# Patient Record
Sex: Male | Born: 1995 | Race: Black or African American | Hispanic: No | Marital: Single | Smoking: Current every day smoker
Health system: Southern US, Community
[De-identification: ages and names within clinical notes are randomized; demographics above are authoritative.]

---
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
## 2017-04-30 ENCOUNTER — Encounter (HOSPITAL_BASED_OUTPATIENT_CLINIC_OR_DEPARTMENT_OTHER): Payer: Self-pay

## 2017-04-30 ENCOUNTER — Emergency Department (HOSPITAL_BASED_OUTPATIENT_CLINIC_OR_DEPARTMENT_OTHER)
Admission: EM | Admit: 2017-04-30 | Discharge: 2017-04-30 | Disposition: A | Discharge status: Home / Self Care | Payer: Self-pay | Attending: Emergency Medicine | Admitting: Emergency Medicine

## 2017-04-30 DIAGNOSIS — F17200 Nicotine dependence, unspecified, uncomplicated: Secondary | ICD-10-CM | POA: Insufficient documentation

## 2017-04-30 DIAGNOSIS — R519 Headache, unspecified: Secondary | ICD-10-CM

## 2017-04-30 DIAGNOSIS — R51 Headache: Secondary | ICD-10-CM | POA: Insufficient documentation

## 2017-04-30 MED ORDER — KETOROLAC TROMETHAMINE 30 MG/ML IJ SOLN
15.0000 mg | Freq: Once | INTRAMUSCULAR | Status: AC
  Administered 2017-04-30: 15 mg via INTRAVENOUS
  Filled 2017-04-30: qty 1

## 2017-04-30 MED ORDER — DIPHENHYDRAMINE HCL 50 MG/ML IJ SOLN
12.5000 mg | Freq: Once | INTRAMUSCULAR | Status: AC
  Administered 2017-04-30: 12.5 mg via INTRAVENOUS
  Filled 2017-04-30: qty 1

## 2017-04-30 MED ORDER — SODIUM CHLORIDE 0.9 % IV BOLUS (SEPSIS)
500.0000 mL | Freq: Once | INTRAVENOUS | Status: AC
  Administered 2017-04-30: 500 mL via INTRAVENOUS

## 2017-04-30 MED ORDER — PROCHLORPERAZINE EDISYLATE 5 MG/ML IJ SOLN
5.0000 mg | Freq: Once | INTRAMUSCULAR | Status: AC
  Administered 2017-04-30: 5 mg via INTRAVENOUS
  Filled 2017-04-30: qty 2

## 2017-04-30 NOTE — ED Triage Notes (Signed)
Headaches x2 weeks. Patient reports no relief from taking tylenol and advil. Patient denies pain related to injury.

## 2017-04-30 NOTE — ED Triage Notes (Signed)
Patient states past apartment contained mold. Patient states family member in household diagnosed with meningitis 04/23/17.

## 2017-04-30 NOTE — ED Provider Notes (Signed)
[facility]   CSN: [id number] Arrival date & time: 04/30/17  1230     History   Chief Complaint Chief Complaint  Patient presents with  . Headache    HPI Norman Bell is a 21 y.o. male who presents for evaluation of headache. His headache has been ongoing for 2 weeks. He states that he wakes up with a headache every morning and he takes Tylenol and it goes away however it comes back after the medication wears off. He denies any neurologic complaints or changes in his vision. He states that he gets frequent headaches however they had never lasted this long before. He denies neck stiffness, rash, fevers. He did state that his sister with whom he was living was diagnosed with meningitis about 1 month ago. He is not sure if it is bacterial or viral. He denies photophobia, phonophobia  HPI  No past medical history on file.  There are no active problems to display for this patient.   No past surgical history on file.     Home Medications    Prior to Admission medications   Not on File    Family History History reviewed. No pertinent family history.  Social History Social History  Substance Use Topics  . Smoking status: Current Every Day Smoker    Packs/day: 0.50  . Smokeless tobacco: Never Used  . Alcohol use No     Allergies   Patient has no known allergies.   Review of Systems Review of Systems  Ten systems reviewed and are negative for acute change, except as noted in the HPI.   Physical Exam Updated Vital Signs BP 123/78 (BP Location: Left Arm)   Pulse 77   Temp 98.7 F (37.1 C) (Oral)   Resp 14   Ht  (1.626 m)   Wt 72.6 kg (160 lb)   SpO2 100%   BMI 27.46 kg/m   Physical Exam  Constitutional: He is oriented to person, place, and time. He appears well-developed and well-nourished. No distress.  HENT:  Head: Normocephalic and atraumatic.  Mouth/Throat: Oropharynx is clear and moist.  Eyes: Pupils are equal,  round, and reactive to light. Conjunctivae and EOM are normal. No scleral icterus.  No horizontal, vertical or rotational nystagmus  Neck: Normal range of motion. Neck supple.  Full active and passive ROM without pain No midline or paraspinal tenderness No nuchal rigidity or meningeal signs  Cardiovascular: Normal rate, regular rhythm, normal heart sounds and intact distal pulses.   Pulmonary/Chest: Effort normal and breath sounds normal. No respiratory distress. He has no wheezes. He has no rales.  Abdominal: Soft. Bowel sounds are normal. There is no tenderness. There is no rebound and no guarding.  Musculoskeletal: Normal range of motion. He exhibits no edema.  Lymphadenopathy:    He has no cervical adenopathy.  Neurological: He is alert and oriented to person, place, and time. No cranial nerve deficit. He exhibits normal muscle tone. Coordination normal.  Mental Status:  Alert, oriented, thought content appropriate. Speech fluent without evidence of aphasia. Able to follow 2 step commands without difficulty.  Cranial Nerves:  II:  Peripheral visual fields grossly normal, pupils equal, round, reactive to light III,IV, VI: ptosis not present, extra-ocular motions intact bilaterally  V,VII: smile symmetric, facial light touch sensation equal VIII: hearing grossly normal bilaterally  IX,X: midline uvula rise  XI: bilateral shoulder shrug equal and strong XII: midline tongue extension  Motor:  5/5 in upper and lower extremities  bilaterally including strong and equal grip strength and dorsiflexion/plantar flexion Sensory: Pinprick and light touch normal in all extremities.  Cerebellar: normal finger-to-nose with bilateral upper extremities Gait: normal gait and balance CV: distal pulses palpable throughout   Skin: Skin is warm and dry. No rash noted. He is not diaphoretic.  Psychiatric: He has a normal mood and affect. His behavior is normal. Judgment and thought content normal.  Nursing  note and vitals reviewed.    ED Treatments / Results  Labs (all labs ordered are listed, but only abnormal results are displayed) Labs Reviewed - No data to display  EKG  EKG Interpretation None       Radiology No results found.  Procedures Procedures (including critical care time)  Medications Ordered in ED Medications  sodium chloride 0.9 % bolus 500 mL (500 mLs Intravenous New Bag/Given 04/30/17 1510)  ketorolac (TORADOL) 30 MG/ML injection 15 mg (15 mg Intravenous Given 04/30/17 1509)  prochlorperazine (COMPAZINE) injection 5 mg (5 mg Intravenous Given 04/30/17 1509)  diphenhydrAMINE (BENADRYL) injection 12.5 mg (12.5 mg Intravenous Given 04/30/17 1510)     Initial Impression / Assessment and Plan / ED Course  I have reviewed the triage vital signs and the nursing notes.  Pertinent labs & imaging results that were available during my care of the patient were reviewed by me and considered in my medical decision making (see chart for details).     Pt HA treated and improved while in ED.  Presentation is not  for [facility], ICH, Meningitis, or temporal arteritis. Pt is afebrile with no focal neuro deficits, nuchal rigidity, or change in vision. Pt is to follow up with PCP to discuss prophylactic medication. Pt verbalizes understanding and is agreeable with plan to dc.    Final Clinical Impressions(s) / ED Diagnoses   Final diagnoses:  Bad headache    New Prescriptions New Prescriptions   No medications on file     Arthor Captain, PA-C 04/30/17 1624    Nira Conn, MD 05/03/17 [phone]

## 2017-04-30 NOTE — Discharge Instructions (Signed)

## 2017-04-30 NOTE — ED Notes (Addendum)
Patient reports that he had a "cold" 2 weeks ago. The patient also reports that he does not know if his sister had viral or bacterial Meningitis. Patient denies any Fevers "just feels hot all the time"  - patient states that he wakes up with the Headache and it fluctuates all day. No change in the pain with lights on or off.

## 2017-05-19 ENCOUNTER — Encounter (HOSPITAL_BASED_OUTPATIENT_CLINIC_OR_DEPARTMENT_OTHER): Payer: Self-pay | Admitting: Emergency Medicine

## 2017-05-19 ENCOUNTER — Emergency Department (HOSPITAL_BASED_OUTPATIENT_CLINIC_OR_DEPARTMENT_OTHER)
Admission: EM | Admit: 2017-05-19 | Discharge: 2017-05-19 | Disposition: A | Discharge status: Home / Self Care | Payer: Self-pay | Attending: Physician Assistant | Admitting: Physician Assistant

## 2017-05-19 ENCOUNTER — Emergency Department (HOSPITAL_BASED_OUTPATIENT_CLINIC_OR_DEPARTMENT_OTHER): Payer: Self-pay

## 2017-05-19 DIAGNOSIS — G43009 Migraine without aura, not intractable, without status migrainosus: Secondary | ICD-10-CM | POA: Insufficient documentation

## 2017-05-19 DIAGNOSIS — F17210 Nicotine dependence, cigarettes, uncomplicated: Secondary | ICD-10-CM | POA: Insufficient documentation

## 2017-05-19 MED ORDER — DIPHENHYDRAMINE HCL 50 MG/ML IJ SOLN: 25.0000 mg | Freq: Once | INTRAMUSCULAR | Status: DC

## 2017-05-19 MED ORDER — SODIUM CHLORIDE 0.9 % IV BOLUS (SEPSIS)
1000.0000 mL | Freq: Once | INTRAVENOUS | Status: AC
  Administered 2017-05-19: 1000 mL via INTRAVENOUS

## 2017-05-19 MED ORDER — BUTALBITAL-APAP-CAFFEINE 50-325-40 MG PO TABS
1.0000 | ORAL_TABLET | Freq: Once | ORAL | Status: DC
  Filled 2017-05-19: qty 1

## 2017-05-19 MED ORDER — KETOROLAC TROMETHAMINE 30 MG/ML IJ SOLN
30.0000 mg | Freq: Once | INTRAMUSCULAR | Status: AC
  Administered 2017-05-19: 30 mg via INTRAVENOUS
  Filled 2017-05-19: qty 1

## 2017-05-19 MED ORDER — PROCHLORPERAZINE EDISYLATE 5 MG/ML IJ SOLN: 10.0000 mg | Freq: Once | INTRAMUSCULAR | Status: DC

## 2017-05-19 MED ORDER — ACETAMINOPHEN 325 MG PO TABS
650.0000 mg | ORAL_TABLET | Freq: Once | ORAL | Status: AC
  Administered 2017-05-19: 650 mg via ORAL
  Filled 2017-05-19: qty 2

## 2017-05-19 NOTE — ED Triage Notes (Signed)
Headache x 1 week. Denies N/V, photophobia. Taking excedrin without relief.

## 2017-05-19 NOTE — Discharge Instructions (Signed)
We are happy to give you IV medications for migraine, however you do not have a ride home. Please return if you would like to get these medications we'll be happy to give them to you.  Otherwise please follow-up with the neurologist given the increased in nature of your headaches. You'll need to follow up with her primary care as well.

## 2017-05-19 NOTE — ED Provider Notes (Signed)
[facility]   CSN: [id number] Arrival date & time: 05/19/17  1528     History   Chief Complaint Chief Complaint  Patient presents with  . Headache    HPI Norman Bell is a 21 y.o. male.  HPI   Patient is 21 year old male presenting with daily headache. Patient was seen here 2 weeks ago for headache and treated and felt better.Pt reports today that his sister had been hospitalized one month ago for meningitis.  However, pattient had reported at last vitist that his sister had been hospitalized 1 month ago for meningitis (whgich would now be 8 weeks ago).  It is really unclear when she was actually hospitalized. He is unsure what kind of meningitis she had. He reports no fevers. No systemic symptoms. He just reports daily headache.  History reviewed. No pertinent past medical history.  There are no active problems to display for this patient.   History reviewed. No pertinent surgical history.     Home Medications    Prior to Admission medications   Not on File    Family History No family history on file.  Social History Social History  Substance Use Topics  . Smoking status: Current Every Day Smoker    Packs/day: 0.50  . Smokeless tobacco: Never Used  . Alcohol use No     Allergies   Patient has no known allergies.   Review of Systems Review of Systems  Constitutional: Negative for activity change.  Respiratory: Negative for shortness of breath.   Cardiovascular: Negative for chest pain.  Gastrointestinal: Negative for abdominal pain.  Neurological: Positive for headaches. Negative for dizziness, weakness and light-headedness.  All other systems reviewed and are negative.    Physical Exam Updated Vital Signs BP 129/68 (BP Location: Left Arm)   Pulse 87   Temp 98 F (36.7 C) (Oral)   Resp 18   Ht  (1.626 m)   Wt 68.9 kg (152 lb)   SpO2 98%   BMI 26.09 kg/m   Physical Exam  Constitutional: He is oriented to  person, place, and time. He appears well-nourished.  HENT:  Head: Normocephalic.  Eyes: Conjunctivae are normal.  Cardiovascular: Normal rate.   Pulmonary/Chest: Effort normal.  Neurological: He is oriented to person, place, and time. No cranial nerve deficit. Coordination normal.  Skin: Skin is warm and dry. He is not diaphoretic.  Psychiatric: He has a normal mood and affect. His behavior is normal.     ED Treatments / Results  Labs (all labs ordered are listed, but only abnormal results are displayed) Labs Reviewed - No data to display  EKG  EKG Interpretation None       Radiology No results found.  Procedures Procedures (including critical care time)  Medications Ordered in ED Medications  sodium chloride 0.9 % bolus 1,000 mL (not administered)  prochlorperazine (COMPAZINE) injection 10 mg (not administered)  diphenhydrAMINE (BENADRYL) injection 25 mg (not administered)     Initial Impression / Assessment and Plan / ED Course  I have reviewed the triage vital signs and the nursing notes.  Pertinent labs & imaging results that were available during my care of the patient were reviewed by me and considered in my medical decision making (see chart for details).     Patient is 21 year old male presenting with daily headache. Patient was seen here 2 weeks ago for headache and treated and felt better.Pt reports today that his sister had been hospitalized one month ago for  meningitis.  However, pattient had reported at last vitist that his sister had been hospitalized 1 month ago for meningitis (whgich would now be 8 weeks ago).  It is really unclear when she was actually hospitalized. He is unsure what kind of meningitis she had. He reports no fevers. No systemic symptoms. He just reports daily headache.  5:15 PM Somewhat odd presentation given the patient does not appear particularly uncomfortable.  He does not appear to have light sensitivity/ He has had long time of  headaches, but worse in the last couple months.. He appears comfortable he essentially reports daily headache. We'll get CT head here given the daily headache. Will treat as migraine. Doubt meningitis or encephalitis given no fever and normal neurologic exam. And again will encourage him to follow-up with primary care physician for prophylactic medicine as an outpatient.  Final Clinical Impressions(s) / ED Diagnoses   Final diagnoses:  None    New Prescriptions New Prescriptions   No medications on file     Abelino Derrick, MD 05/19/17 1717

## 2017-05-19 NOTE — ED Notes (Signed)
Patient transported to CT 

## 2017-06-15 ENCOUNTER — Encounter (HOSPITAL_BASED_OUTPATIENT_CLINIC_OR_DEPARTMENT_OTHER): Payer: Self-pay

## 2017-06-15 ENCOUNTER — Emergency Department (HOSPITAL_BASED_OUTPATIENT_CLINIC_OR_DEPARTMENT_OTHER)
Admission: EM | Admit: 2017-06-15 | Discharge: 2017-06-15 | Disposition: A | Discharge status: Home / Self Care | Payer: Self-pay | Attending: Emergency Medicine | Admitting: Emergency Medicine

## 2017-06-15 ENCOUNTER — Encounter: Payer: Self-pay | Admitting: Neurology

## 2017-06-15 DIAGNOSIS — R51 Headache: Secondary | ICD-10-CM | POA: Insufficient documentation

## 2017-06-15 DIAGNOSIS — R519 Headache, unspecified: Secondary | ICD-10-CM

## 2017-06-15 DIAGNOSIS — F17200 Nicotine dependence, unspecified, uncomplicated: Secondary | ICD-10-CM | POA: Insufficient documentation

## 2017-06-15 MED ORDER — PROCHLORPERAZINE EDISYLATE 5 MG/ML IJ SOLN
10.0000 mg | Freq: Once | INTRAMUSCULAR | Status: AC
  Administered 2017-06-15: 10 mg via INTRAMUSCULAR
  Filled 2017-06-15: qty 2

## 2017-06-15 MED ORDER — DIPHENHYDRAMINE HCL 50 MG/ML IJ SOLN
50.0000 mg | Freq: Once | INTRAMUSCULAR | Status: AC
  Administered 2017-06-15: 50 mg via INTRAMUSCULAR
  Filled 2017-06-15: qty 1

## 2017-06-15 NOTE — ED Triage Notes (Signed)
C/o migraine x 2 days-NAD-steady gait

## 2017-06-15 NOTE — Discharge Instructions (Signed)
Follow up with a neurologist.  Return for worsening symptoms.

## 2017-06-15 NOTE — ED Provider Notes (Signed)
[facility] EMERGENCY DEPARTMENT Provider Note   CSN: [id number] Arrival date & time: 06/15/17  1234     History   Chief Complaint Chief Complaint  Patient presents with  . Migraine    HPI Norman Bell is a 21 y.o. male.  21 yo M with a chief complaint of a headache.  Patient has been having headaches chronically this 1 has been going on for the past 3 weeks.  Feels like his prior headaches.  Has never seen a doctor outside of the emergency department for this.  Denies head injury denies morning headaches.  Worse with bright lights.  Denies URI-like symptoms.  Denies cough or congestion.  Denies fevers.   The history is provided by the patient.  Migraine  This is a new problem. The current episode started more than 1 week ago. The problem occurs constantly. The problem has not changed since onset.Associated symptoms include headaches. Pertinent negatives include no chest pain, no abdominal pain and no shortness of breath. Nothing aggravates the symptoms. Nothing relieves the symptoms. He has tried nothing for the symptoms. The treatment provided no relief.    History reviewed. No pertinent past medical history.  There are no active problems to display for this patient.   History reviewed. No pertinent surgical history.     Home Medications    Prior to Admission medications   Not on File    Family History No family history on file.  Social History Social History  Substance Use Topics  . Smoking status: Current Every Day Smoker    Packs/day: 0.50  . Smokeless tobacco: Never Used  . Alcohol use No     Allergies   Patient has no known allergies.   Review of Systems Review of Systems  Constitutional: Negative for chills and fever.  HENT: Negative for congestion and facial swelling.   Eyes: Negative for discharge and visual disturbance.  Respiratory: Negative for shortness of breath.   Cardiovascular: Negative for chest pain and palpitations.    Gastrointestinal: Negative for abdominal pain, diarrhea and vomiting.  Musculoskeletal: Negative for arthralgias and myalgias.  Skin: Negative for color change and rash.  Neurological: Positive for headaches. Negative for tremors and syncope.  Psychiatric/Behavioral: Negative for confusion and dysphoric mood.     Physical Exam Updated Vital Signs BP 131/85 (BP Location: Right Arm)   Pulse 77   Temp 98.3 F (36.8 C) (Oral)   Resp 16   Ht 5\' 4"  (1.626 m)   Wt 70.9 kg (156 lb 4.9 oz)   SpO2 100%   BMI 26.83 kg/m   Physical Exam  Constitutional: He is oriented to person, place, and time. He appears well-developed and well-nourished.  HENT:  Head: Normocephalic and atraumatic.  Eyes: Pupils are equal, round, and reactive to light. EOM are normal.  Neck: Normal range of motion. Neck supple. No JVD present.  Cardiovascular: Normal rate and regular rhythm.  Exam reveals no gallop and no friction rub.   No murmur heard. Pulmonary/Chest: No respiratory distress. He has no wheezes.  Abdominal: He exhibits no distension. There is no rebound and no guarding.  Musculoskeletal: Normal range of motion.  Neurological: He is alert and oriented to person, place, and time. He has normal strength. No cranial nerve deficit or sensory deficit. He displays a negative Romberg sign. Coordination and gait normal. GCS eye subscore is 4. GCS verbal subscore is 5. GCS motor subscore is 6. He displays no Babinski's sign on the right side. He displays  no Babinski's sign on the left side.  Benign neuro exam  Skin: No rash noted. No pallor.  Psychiatric: He has a normal mood and affect. His behavior is normal.  Nursing note and vitals reviewed.    ED Treatments / Results  Labs (all labs ordered are listed, but only abnormal results are displayed) Labs Reviewed - No data to display  EKG  EKG Interpretation None       Radiology No results found.  Procedures Procedures (including critical care  time)  Medications Ordered in ED Medications  prochlorperazine (COMPAZINE) injection 10 mg (10 mg Intramuscular Given 06/15/17 1500)  diphenhydrAMINE (BENADRYL) injection 50 mg (50 mg Intramuscular Given 06/15/17 1501)     Initial Impression / Assessment and Plan / ED Course  I have reviewed the triage vital signs and the nursing notes.  Pertinent labs & imaging results that were available during my care of the patient were reviewed by me and considered in my medical decision making (see chart for details).     21 yo M with a chief complaint of headache.  Feels like his prior.  Never seen a neurologist for this.  Benign neuro exam.  Treat his headache with a headache cocktail.  Reassess.  Feeling better, will d/c home.   3:34 PM:  I have discussed the diagnosis/risks/treatment options with the patient and family and believe the pt to be eligible for discharge home to follow-up with PCP. We also discussed returning to the ED immediately if new or worsening sx occur. We discussed the sx which are most concerning (e.g., sudden worsening pain, fever, inability to tolerate by mouth) that necessitate immediate return. Medications administered to the patient during their visit and any new prescriptions provided to the patient are listed below.  Medications given during this visit Medications  prochlorperazine (COMPAZINE) injection 10 mg (10 mg Intramuscular Given 06/15/17 1500)  diphenhydrAMINE (BENADRYL) injection 50 mg (50 mg Intramuscular Given 06/15/17 1501)     The patient appears reasonably screen and/or stabilized for discharge and I doubt any other medical condition or other [facility] requiring further screening, evaluation, or treatment in the ED at this time prior to discharge.      Final Clinical Impressions(s) / ED Diagnoses   Final diagnoses:  Acute nonintractable headache, unspecified headache type  Headache disorder    New Prescriptions New Prescriptions   No medications  on file     Melene Plan, DO 06/15/17 1534

## 2017-07-18 ENCOUNTER — Other Ambulatory Visit: Payer: Self-pay

## 2017-07-18 ENCOUNTER — Encounter (HOSPITAL_BASED_OUTPATIENT_CLINIC_OR_DEPARTMENT_OTHER): Payer: Self-pay | Admitting: Emergency Medicine

## 2017-07-18 ENCOUNTER — Emergency Department (HOSPITAL_BASED_OUTPATIENT_CLINIC_OR_DEPARTMENT_OTHER)
Admission: EM | Admit: 2017-07-18 | Discharge: 2017-07-18 | Disposition: A | Discharge status: Home / Self Care | Payer: Self-pay | Attending: Emergency Medicine | Admitting: Emergency Medicine

## 2017-07-18 DIAGNOSIS — Z202 Contact with and (suspected) exposure to infections with a predominantly sexual mode of transmission: Secondary | ICD-10-CM | POA: Insufficient documentation

## 2017-07-18 DIAGNOSIS — F17200 Nicotine dependence, unspecified, uncomplicated: Secondary | ICD-10-CM | POA: Insufficient documentation

## 2017-07-18 DIAGNOSIS — R519 Headache, unspecified: Secondary | ICD-10-CM

## 2017-07-18 DIAGNOSIS — R1084 Generalized abdominal pain: Secondary | ICD-10-CM | POA: Insufficient documentation

## 2017-07-18 DIAGNOSIS — R51 Headache: Secondary | ICD-10-CM | POA: Insufficient documentation

## 2017-07-18 LAB — URINALYSIS, ROUTINE W REFLEX MICROSCOPIC
BILIRUBIN URINE: NEGATIVE
GLUCOSE, UA: NEGATIVE mg/dL
Hgb urine dipstick: NEGATIVE
KETONES UR: NEGATIVE mg/dL
Leukocytes, UA: NEGATIVE
Nitrite: NEGATIVE
PH: 6.5 (ref 5.0–8.0)
Protein, ur: NEGATIVE mg/dL
SPECIFIC GRAVITY, URINE: 1.02 (ref 1.005–1.030)

## 2017-07-18 LAB — COMPREHENSIVE METABOLIC PANEL
ALBUMIN: 4 g/dL (ref 3.5–5.0)
ALK PHOS: 59 U/L (ref 38–126)
ALT: 43 U/L (ref 17–63)
ANION GAP: 4 — AB (ref 5–15)
AST: 24 U/L (ref 15–41)
BILIRUBIN TOTAL: 0.5 mg/dL (ref 0.3–1.2)
BUN: 12 mg/dL (ref 6–20)
CO2: 23 mmol/L (ref 22–32)
Calcium: 8.6 mg/dL — ABNORMAL LOW (ref 8.9–10.3)
Chloride: 109 mmol/L (ref 101–111)
Creatinine, Ser: 1.03 mg/dL (ref 0.61–1.24)
GFR calc non Af Amer: >60 mL/min (ref >60)
Glucose, Bld: 99 mg/dL (ref 65–99)
POTASSIUM: 4 mmol/L (ref 3.5–5.1)
Sodium: 136 mmol/L (ref 135–145)
Total Protein: 6.9 g/dL (ref 6.5–8.1)

## 2017-07-18 LAB — CBC WITH DIFFERENTIAL/PLATELET
BASOS PCT: 0 %
Basophils Absolute: 0 10*3/uL (ref 0.0–0.1)
EOS ABS: 0.3 10*3/uL (ref 0.0–0.7)
Eosinophils Relative: 5 %
HEMATOCRIT: 39.7 % (ref 39.0–52.0)
HEMOGLOBIN: 14.1 g/dL (ref 13.0–17.0)
LYMPHS ABS: 2 10*3/uL (ref 0.7–4.0)
Lymphocytes Relative: 36 %
MCH: 32.4 pg (ref 26.0–34.0)
MCHC: 35.5 g/dL (ref 30.0–36.0)
MCV: 91.3 fL (ref 78.0–100.0)
MONO ABS: 0.7 10*3/uL (ref 0.1–1.0)
MONOS PCT: 13 %
NEUTROS PCT: 46 %
Neutro Abs: 2.5 10*3/uL (ref 1.7–7.7)
Platelets: 231 10*3/uL (ref 150–400)
RBC: 4.35 MIL/uL (ref 4.22–5.81)
RDW: 11.7 % (ref 11.5–15.5)
WBC: 5.4 10*3/uL (ref 4.0–10.5)

## 2017-07-18 MED ORDER — LIDOCAINE 5 % EX PTCH
1.0000 | MEDICATED_PATCH | CUTANEOUS | Status: DC
  Filled 2017-07-18: qty 1

## 2017-07-18 MED ORDER — METOCLOPRAMIDE HCL 10 MG PO TABS
10.0000 mg | ORAL_TABLET | Freq: Once | ORAL | Status: AC
  Administered 2017-07-18: 10 mg via ORAL
  Filled 2017-07-18: qty 1

## 2017-07-18 MED ORDER — IBUPROFEN 400 MG PO TABS
600.0000 mg | ORAL_TABLET | Freq: Once | ORAL | Status: AC
  Administered 2017-07-18: 08:00:00 600 mg via ORAL
  Filled 2017-07-18: qty 1

## 2017-07-18 NOTE — Discharge Instructions (Signed)
Continue ibuprofen and tylenol for headache as needed, but avoid taking them more than 5-7 days around the clock because they will cause a rebound headache.  Please follow-up with a primary care provider. They can also help in headache management. You are given neurology info as needed as well.  Your blood work is reassuring. Please return without fail for worsening symptoms, including vomiting, escalating pain, fever, confusion, or any other symptoms concerning to you.

## 2017-07-18 NOTE — ED Provider Notes (Signed)
[facility] EMERGENCY DEPARTMENT Provider Note   CSN: [id number] Arrival date & time: 07/18/17  [id number]     History   Chief Complaint Chief Complaint  Patient presents with  . Exposure to STD  . Generalized Body Aches    HPI Orris Toni ArthursFuller is a 21 y.o. male.  HPI 21 year old male who presents with multiple complaints, generalized headache, intermittent low abdominal pain, and concern for STD exposure.  States for several months now he has had daily generalized headache.  Has been to the ED twice before for evaluation.  States that he continues to take Tylenol and Excedrin daily without improvement in his headaches.  Denies nausea, vomiting, fevers, URI symptoms, vision or speech changes, focal numbness or weakness, or difficulty walking.  States that over the past few weeks he has had some intermittent low abdominal pain, crampy in nature like he is about to have diarrhea but does not.  Denies any associating fever, vomiting or diarrhea, dysuria or urinary frequency, association with food.  Also requests STD testing.  Denies any penile discharge.  Has been sexually active with one partner, and occasionally uses barrier contraception. History reviewed. No pertinent past medical history.  There are no active problems to display for this patient.   History reviewed. No pertinent surgical history.     Home Medications    Prior to Admission medications   Not on File    Family History History reviewed. No pertinent family history.  Social History Social History   Tobacco Use  . Smoking status: Current Every Day Smoker    Packs/day: 0.50  . Smokeless tobacco: Never Used  Substance Use Topics  . Alcohol use: No  . Drug use: Yes    Types: Marijuana     Allergies   Patient has no known allergies.   Review of Systems Review of Systems  Constitutional: Negative for fever.  Respiratory: Negative for shortness of breath.   Cardiovascular: Negative for chest  pain.  Gastrointestinal: Positive for abdominal pain.  Genitourinary: Negative for difficulty urinating and dysuria.  Neurological: Positive for headaches.  All other systems reviewed and are negative.    Physical Exam Updated Vital Signs BP 135/86 (BP Location: Right Arm)   Pulse 70   Temp 97.8 F (36.6 C) (Oral)   Resp 16   Ht 5\' 4"  (1.626 m)   Wt 67.6 kg (149 lb)   SpO2 100%   BMI 25.58 kg/m   Physical Exam Physical Exam  Nursing note and vitals reviewed. Constitutional: Well developed, well nourished, non-toxic, and in no acute distress Head: Normocephalic and atraumatic.  Mouth/Throat: Oropharynx is clear and moist.  Neck: Normal range of motion. Neck supple. No nuchal rigidity Cardiovascular: Normal rate and regular rhythm.   Pulmonary/Chest: Effort normal and breath sounds normal.  Abdominal: Soft. There is no tenderness. There is no rebound and no guarding.  Musculoskeletal: Normal range of motion.  Neurological: Alert, PERRL, no facial droop, fluent speech, moves all extremities symmetrically, full strength bilateral upper and lower extremities, full sensation bilateral upper and lower extremity, non-ataxic gait, no dysmetria, VF in tact Skin: Skin is warm and dry.  Psychiatric: Cooperative   ED Treatments / Results  Labs (all labs ordered are listed, but only abnormal results are displayed) Labs Reviewed  COMPREHENSIVE METABOLIC PANEL - Abnormal; Notable for the following components:      Result Value   Calcium 8.6 (*)    Anion gap 4 (*)    All other components within  normal limits  URINALYSIS, ROUTINE W REFLEX MICROSCOPIC  CBC WITH DIFFERENTIAL/PLATELET  HIV ANTIBODY (ROUTINE TESTING)  RPR  GC/CHLAMYDIA PROBE AMP ([place]) NOT AT [facility]    EKG  EKG Interpretation None       Radiology No results found.  Procedures Procedures (including critical care time)  Medications Ordered in ED Medications  ibuprofen (ADVIL,MOTRIN) tablet 600 mg (600  mg Oral Given 07/18/17 0809)  metoCLOPramide (REGLAN) tablet 10 mg (10 mg Oral Given 07/18/17 0809)     Initial Impression / Assessment and Plan / ED Course  I have reviewed the triage vital signs and the nursing notes.  Pertinent labs & imaging results that were available during my care of the patient were reviewed by me and considered in my medical decision making (see chart for details).     Records reviewed. Seen in Oct twice for generalized headaches. Was referred to neurology which he has not followed with. CT head in Oct reviewed and unremarkable.  He has a normal neurological exam today.  I do not feel that further head imaging is required given normal CT head a few months ago. Patient to continue over-the-counter analgesics as needed.  Referral to neurology and PCP provided for ongoing treatment and workup as needed.  Abdomen is soft and benign, currently nontender.  Blood work reassuring.  No significant STD symptoms, but STD screening tests were obtained.  No empiric treatment provided as he does not have significant symptoms. Presentation not concerning for serious intraabdominal processes currently.  Strict return and follow-up instructions reviewed. He expressed understanding of all discharge instructions and felt comfortable with the plan of care.   Final Clinical Impressions(s) / ED Diagnoses   Final diagnoses:  Generalized headache  Generalized abdominal pain    ED Discharge Orders    None       Lavera GuiseLiu, Tyjah Hai Duo, MD 07/18/17 [phone]

## 2017-07-18 NOTE — ED Triage Notes (Addendum)
Patient reports to ER for STD check.  Reports "flu like symptoms", abdominal pain.  Denies penile discharge, dysuria, hematuria.  States he is unaware if he was exposed to an STD or not.

## 2017-07-19 LAB — RPR: RPR Ser Ql: NONREACTIVE

## 2017-07-19 LAB — HIV ANTIBODY (ROUTINE TESTING W REFLEX): HIV Screen 4th Generation wRfx: NONREACTIVE

## 2017-07-19 LAB — GC/CHLAMYDIA PROBE AMP (~~LOC~~) NOT AT ARMC
Chlamydia: NEGATIVE
Neisseria Gonorrhea: NEGATIVE

## 2017-09-28 ENCOUNTER — Ambulatory Visit: Payer: Self-pay | Admitting: Neurology

## 2019-07-04 IMAGING — CT CT HEAD W/O CM
3 series · 15 of 47 positions shown, 18 images · non-contrast
Comparison: None.

CLINICAL DATA: Headache.

EXAM:
CT HEAD WITHOUT CONTRAST
TECHNIQUE: Contiguous axial images were obtained from the base of the skull
through the vertex without intravenous contrast.

[Series 2: head wo · axial · 0.43mm/px · z∈[+1238,+1368]mm · 9 of 32 slices shown, 12 images]
[im 3/32  brain]
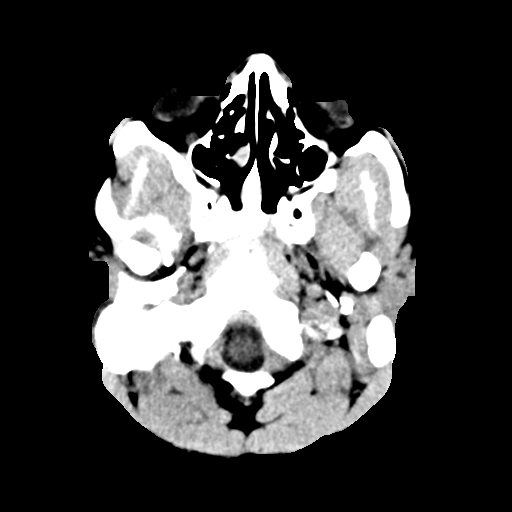
[im 3/32  bone]
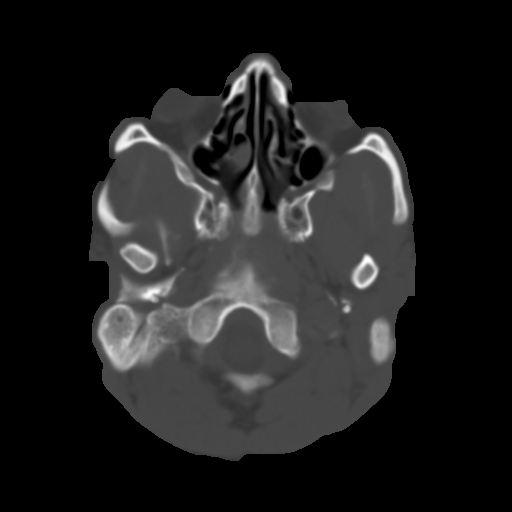
[im 6/32  brain]
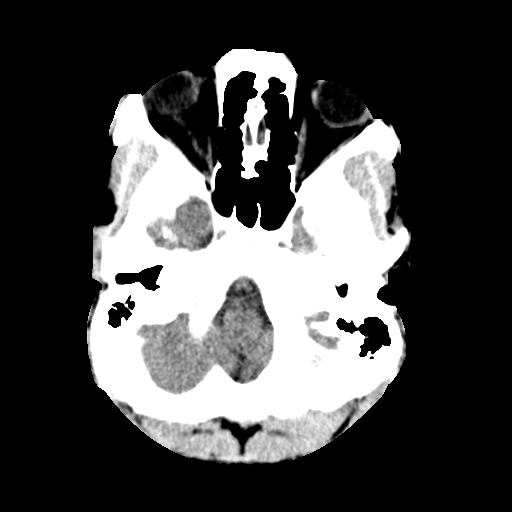
[im 9/32  brain]
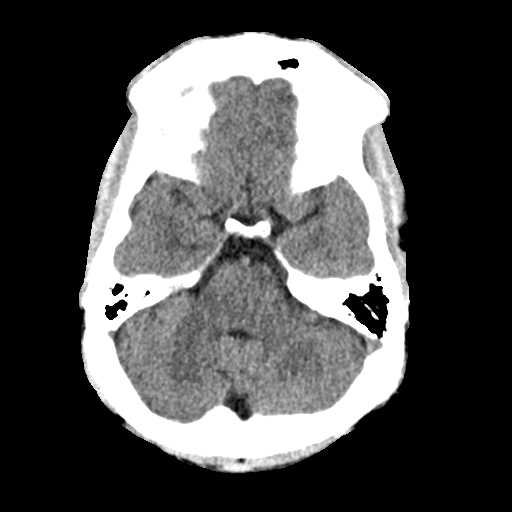
[im 12/32  brain]
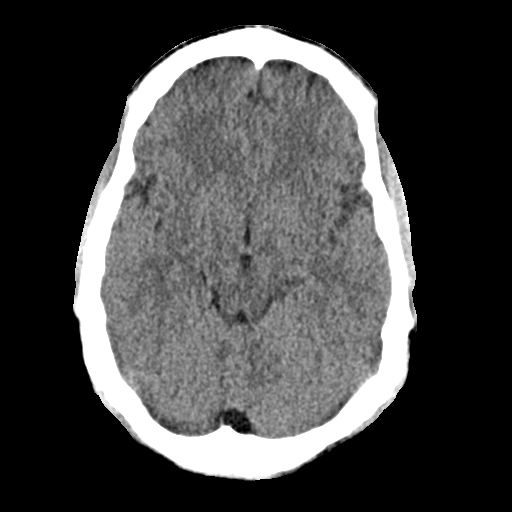
[im 17/32  brain]
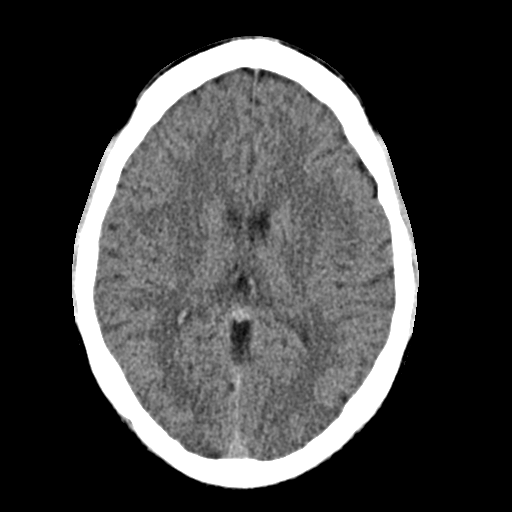
[im 17/32  bone]
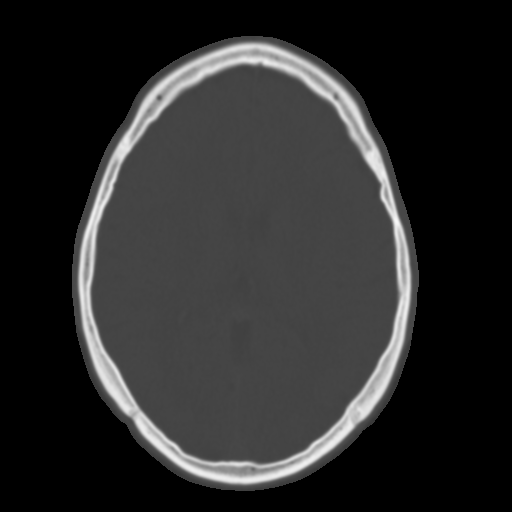
[im 20/32  brain]
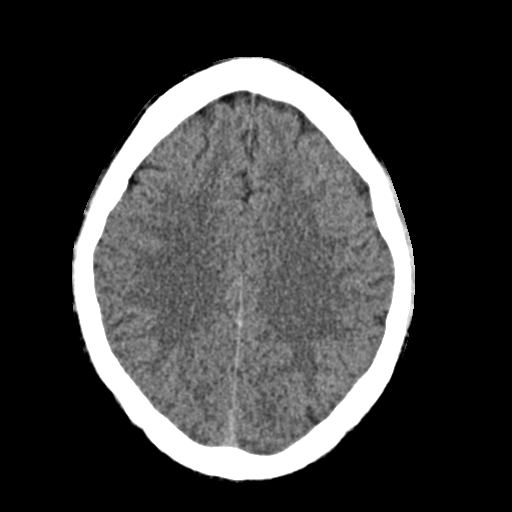
[im 23/32  brain]
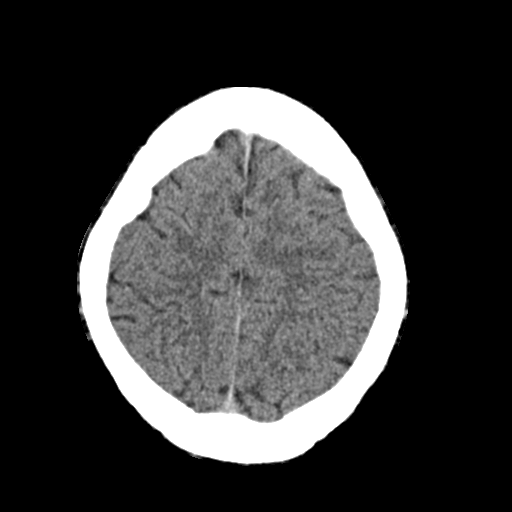
[im 26/32  brain]
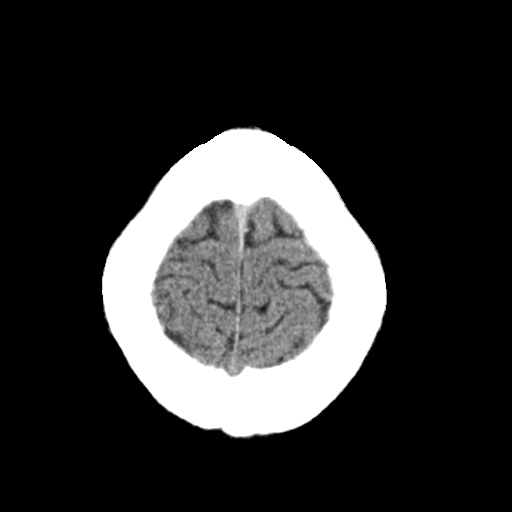
[im 29/32  brain]
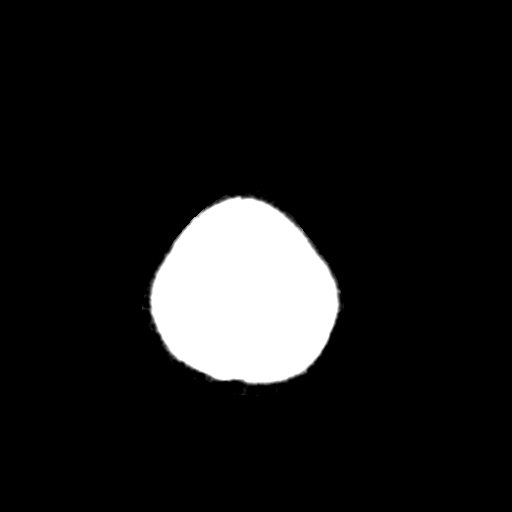
[im 29/32  bone]
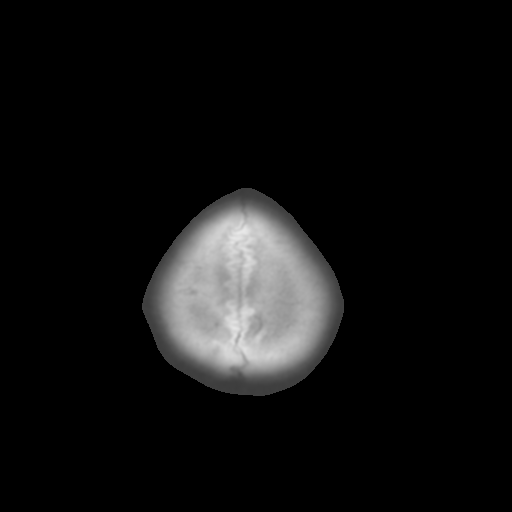

[Series 4: coronal soft · coronal · 0.31mm/px · 3 of 72 slices shown]
[im 24/72  brain]
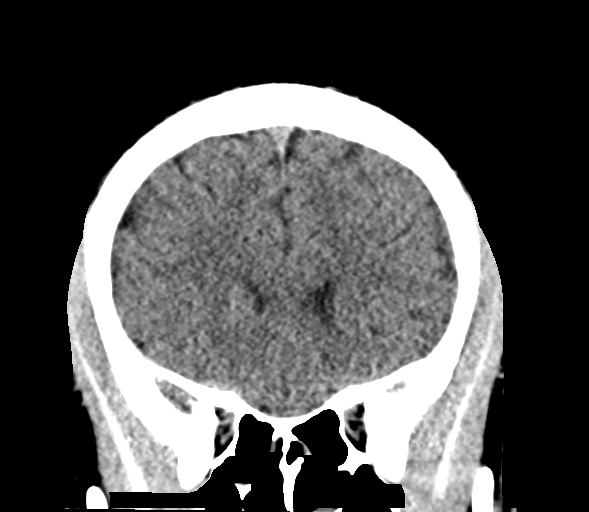
[im 32/72  brain]
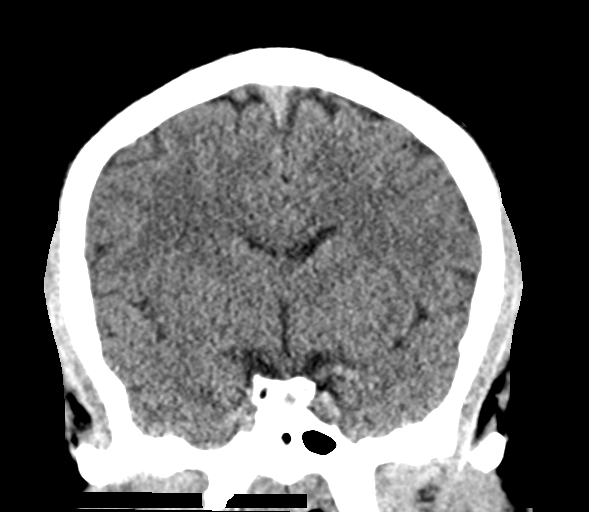
[im 40/72  brain]
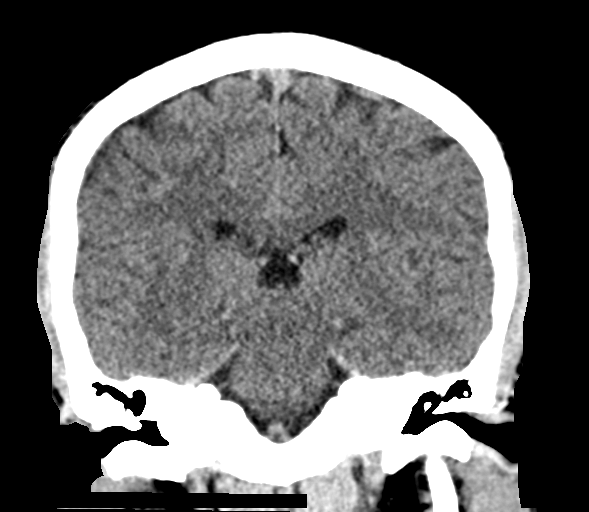

[Series 5: sag soft · sagittal · 0.31mm/px · 3 of 62 slices shown]
[im 21/62  brain]
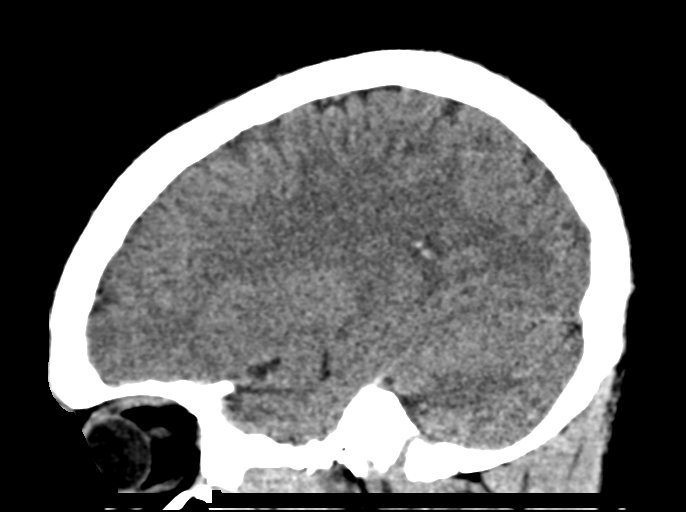
[im 31/62  brain]
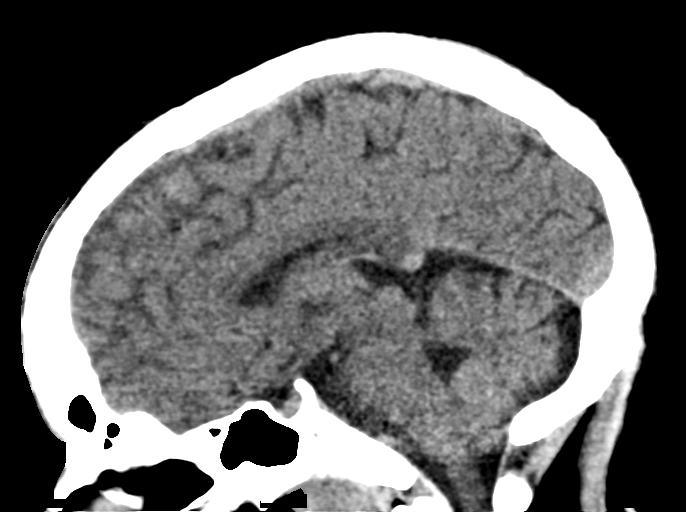
[im 41/62  brain]
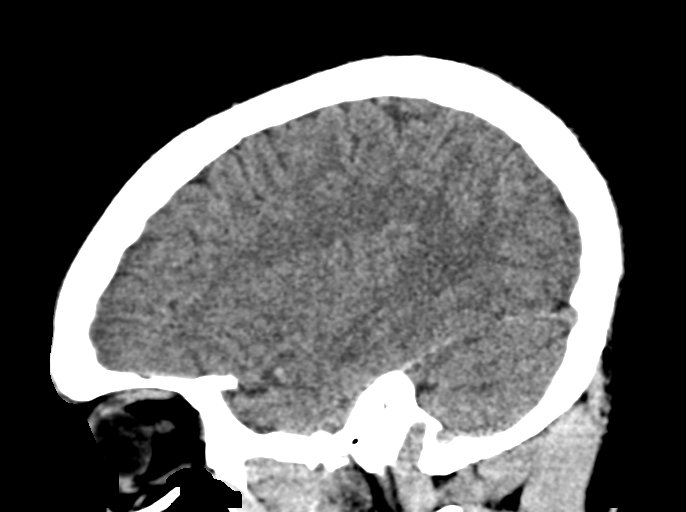

[15 of 47 positions shown; findings below may reference images not displayed]

FINDINGS: Brain: No evidence of acute infarction, hemorrhage, hydrocephalus,
extra-axial collection or mass lesion/mass effect.

Vascular: No hyperdense vessel or unexpected calcification.

Skull: Normal. Negative for fracture or focal lesion.

Sinuses/Orbits: The bilateral paranasal sinuses and mastoid air
cells are clear. The orbits are unremarkable.

Other: None.
IMPRESSION: Normal noncontrast head CT.

## 2024-08-10 ENCOUNTER — Encounter (HOSPITAL_COMMUNITY): Payer: Self-pay

## 2024-08-10 ENCOUNTER — Ambulatory Visit (HOSPITAL_COMMUNITY)
Admission: EM | Admit: 2024-08-10 | Discharge: 2024-08-10 | Disposition: A | Discharge status: Home / Self Care | Attending: Physician Assistant | Admitting: Physician Assistant

## 2024-08-10 DIAGNOSIS — J101 Influenza due to other identified influenza virus with other respiratory manifestations: Secondary | ICD-10-CM

## 2024-08-10 DIAGNOSIS — J4521 Mild intermittent asthma with (acute) exacerbation: Secondary | ICD-10-CM

## 2024-08-10 DIAGNOSIS — R051 Acute cough: Secondary | ICD-10-CM | POA: Diagnosis not present

## 2024-08-10 LAB — POC COVID19/FLU A&B COMBO
Covid Antigen, POC: NEGATIVE
Influenza A Antigen, POC: NEGATIVE
Influenza B Antigen, POC: POSITIVE — AB

## 2024-08-10 MED ORDER — METHYLPREDNISOLONE SODIUM SUCC 125 MG IJ SOLR
INTRAMUSCULAR | Status: AC
  Filled 2024-08-10: qty 2

## 2024-08-10 MED ORDER — METHYLPREDNISOLONE SODIUM SUCC 125 MG IJ SOLR
60.0000 mg | Freq: Once | INTRAMUSCULAR | Status: AC
  Administered 2024-08-10: 60 mg via INTRAMUSCULAR

## 2024-08-10 MED ORDER — OSELTAMIVIR PHOSPHATE 75 MG PO CAPS: 75.0000 mg | ORAL_CAPSULE | Freq: Two times a day (BID) | ORAL | 0 refills | Status: AC

## 2024-08-10 MED ORDER — PREDNISONE 20 MG PO TABS: 40.0000 mg | ORAL_TABLET | Freq: Every day | ORAL | 0 refills | Status: AC

## 2024-08-10 MED ORDER — PROMETHAZINE-DM 6.25-15 MG/5ML PO SYRP: 5.0000 mL | ORAL_SOLUTION | Freq: Two times a day (BID) | ORAL | 0 refills | Status: AC | PRN

## 2024-08-10 MED ORDER — IPRATROPIUM-ALBUTEROL 0.5-2.5 (3) MG/3ML IN SOLN
RESPIRATORY_TRACT | Status: AC
  Filled 2024-08-10: qty 3

## 2024-08-10 MED ORDER — IPRATROPIUM-ALBUTEROL 0.5-2.5 (3) MG/3ML IN SOLN
3.0000 mL | Freq: Once | RESPIRATORY_TRACT | Status: AC
  Administered 2024-08-10: 3 mL via RESPIRATORY_TRACT

## 2024-08-10 MED ORDER — ALBUTEROL SULFATE HFA 108 (90 BASE) MCG/ACT IN AERS: 1.0000 | INHALATION_SPRAY | Freq: Four times a day (QID) | RESPIRATORY_TRACT | 0 refills | Status: AC | PRN

## 2024-08-10 NOTE — Discharge Instructions (Signed)
 You tested positive for influenza B.  Start Tamiflu  twice daily for 5 days.  I am glad you are feeling better after the medication.  Use albuterol  every 4-6 hours as needed for shortness of breath and coughing fits.  Start prednisone  tomorrow (08/11/2024).  Do not take NSAIDs with this medication including aspirin, ibuprofen /Advil , naproxen/Aleve.  Use Promethazine  DM for cough.  This will make you sleepy so do not drive or drink alcohol while taking it.  You can use Mucinex, Flonase, nasal saline/sinus rinses, humidifier in your room to help manage the symptoms.  If you are not feeling better within 3 to 5 days please return for reevaluation.  If anything worsens and you have high fever, worsening cough, shortness of breath,, chest pain weakness you need to be seen immediately.

## 2024-08-10 NOTE — ED Provider Notes (Signed)
 " [facility]    CSN: [id number] Arrival date & time: 08/10/24  0841      History   Chief Complaint Chief Complaint  Patient presents with   Shortness of Breath   Cough    HPI Norman Bell is a 28 y.o. male.   Patient presents today with a 2-day history of URI symptoms.  He reports subjective fever, shortness of breath, cough, body aches, fatigue.  He did have an episode of nausea/vomiting when his symptoms began but thought that this was related to the medication he had just taken and he has not had any recurrent GI symptoms.  He has been taking DayQuil and NyQuil without improvement of symptoms.  His last dose was this morning about 2-1/2 hours ago.  He reports that his niece has been sick with similar symptoms.  He does report a history of asthma when he was younger but has not needed an albuterol  inhaler in adulthood.  He does smoke but denies history of COPD.  Denies any recent antibiotics or steroids.  He has not had influenza or COVID-19 vaccines.  He had COVID several years ago but not within the past 90 days.    History reviewed. No pertinent past medical history.  There are no active problems to display for this patient.   History reviewed. No pertinent surgical history.     Home Medications    Prior to Admission medications  Medication Sig Start Date End Date Taking? Authorizing Provider  albuterol  (VENTOLIN  HFA) 108 (90 Base) MCG/ACT inhaler Inhale 1-2 puffs into the lungs every 6 (six) hours as needed for wheezing or shortness of breath. 08/10/24  Yes Yetunde Leis K, PA-C  oseltamivir  (TAMIFLU ) 75 MG capsule Take 1 capsule (75 mg total) by mouth every 12 (twelve) hours. 08/10/24  Yes Dareth Andrew K, PA-C  predniSONE  (DELTASONE ) 20 MG tablet Take 2 tablets (40 mg total) by mouth daily for 5 days. 08/10/24 08/15/24 Yes Oceane Fosse K, PA-C  promethazine -dextromethorphan (PROMETHAZINE -DM) 6.25-15 MG/5ML syrup Take 5 mLs by mouth 2 (two) times daily as  needed for cough. 08/10/24  Yes Lovelace Cerveny, Rocky POUR, PA-C    Family History History reviewed. No pertinent family history.  Social History Social History[1]   Allergies   Patient has no known allergies.   Review of Systems Review of Systems  Constitutional:  Positive for activity change, fatigue and fever. Negative for appetite change.  HENT:  Positive for congestion and sinus pressure. Negative for sneezing and sore throat.   Respiratory:  Positive for cough and shortness of breath.   Cardiovascular:  Negative for chest pain.  Gastrointestinal:  Negative for diarrhea, nausea and vomiting (Resolved).  Musculoskeletal:  Positive for arthralgias and myalgias.  Neurological:  Negative for dizziness, light-headedness and headaches.     Physical Exam Triage Vital Signs ED Triage Vitals  Encounter Vitals Group     BP 08/10/24 1029 (!) 142/83     Girls Systolic BP Percentile --      Girls Diastolic BP Percentile --      Boys Systolic BP Percentile --      Boys Diastolic BP Percentile --      Pulse Rate 08/10/24 1029 84     Resp 08/10/24 1029 16     Temp 08/10/24 1029 98.4 F (36.9 C)     Temp src --      SpO2 08/10/24 1029 96 %     Weight --      Height --  Head Circumference --      Peak Flow --      Pain Score 08/10/24 1026 8     Pain Loc --      Pain Education --      Exclude from Growth Chart --    No data found.  Updated Vital Signs BP (!) 142/83 (BP Location: Left Arm)   Pulse 84   Temp 98.4 F (36.9 C)   Resp 16   SpO2 96%   Visual Acuity Right Eye Distance:   Left Eye Distance:   Bilateral Distance:    Right Eye Near:   Left Eye Near:    Bilateral Near:     Physical Exam Vitals reviewed.  Constitutional:      General: He is awake.     Appearance: Normal appearance. He is well-developed. He is not ill-appearing.     Comments: Very pleasant male appears stated age in no acute distress sitting comfortably in exam room  HENT:     Head:  Normocephalic and atraumatic.     Right Ear: Tympanic membrane, ear canal and external ear normal. Tympanic membrane is not erythematous or bulging.     Left Ear: Tympanic membrane, ear canal and external ear normal. Tympanic membrane is not erythematous or bulging.     Nose: Nose normal.     Mouth/Throat:     Pharynx: Uvula midline. No oropharyngeal exudate, posterior oropharyngeal erythema or uvula swelling.  Cardiovascular:     Rate and Rhythm: Normal rate and regular rhythm.     Heart sounds: Normal heart sounds, S1 normal and S2 normal. No murmur heard. Pulmonary:     Effort: Pulmonary effort is normal. No accessory muscle usage or respiratory distress.     Breath sounds: No stridor. Wheezing present. No rhonchi or rales.     Comments: Widespread wheezing improved significantly following DuoNeb in clinic Neurological:     Mental Status: He is alert.  Psychiatric:        Behavior: Behavior is cooperative.      UC Treatments / Results  Labs (all labs ordered are listed, but only abnormal results are displayed) Labs Reviewed  POC COVID19/FLU A&B COMBO - Abnormal; Notable for the following components:      Result Value   Influenza B Antigen, POC Positive (*)    All other components within normal limits    EKG   Radiology No results found.  Procedures Procedures (including critical care time)  Medications Ordered in UC Medications  ipratropium-albuterol  (DUONEB) 0.5-2.5 (3) MG/3ML nebulizer solution 3 mL (3 mLs Nebulization Given 08/10/24 1047)  methylPREDNISolone  sodium succinate (SOLU-MEDROL ) 125 mg/2 mL injection 60 mg (60 mg Intramuscular Given 08/10/24 1047)    Initial Impression / Assessment and Plan / UC Course  I have reviewed the triage vital signs and the nursing notes.  Pertinent labs & imaging results that were available during my care of the patient were reviewed by me and considered in my medical decision making (see chart for details).     Patient  is well-appearing, afebrile, nontoxic, nontachycardic.  No evidence of acute infection on physical exam that warranted initiation of antibiotics.  Patient tested positive for influenza B.  He is within 48 hours of symptom onset so we will start Tamiflu  75 mg twice daily for 5 days.  He had widespread wheezing and I am concerned that underlying asthma has been exacerbated by viral illness.  He was given DuoNeb and Solu-Medrol  in clinic with significant improvement of  symptoms.  Will start prednisone  40 mg for 5 days beginning tomorrow (08/11/2024) we discussed that he is not to take NSAIDs with this medication due to risk of GI bleeding.  Albuterol  sent to pharmacy to be used every 4-6 hours as needed.  He was also given Promethazine  DM for cough.  We discussed that this can be sedating and he is not to drive or drink alcohol with taking it.  Chest x-ray was deferred as his adventitious lung sounds resolved with bronchodilation therapy in clinic and his oxygen saturation was appropriate at 96 to 97%.  We discussed that if he is not feeling better within 3 to 5 days he is to return for reevaluation.  If he has any worsening symptoms he needs to be seen immediately.  Return precautions given.  Excuse note provided.  Final Clinical Impressions(s) / UC Diagnoses   Final diagnoses:  Acute cough  Influenza B  Mild intermittent asthma with acute exacerbation     Discharge Instructions      You tested positive for influenza B.  Start Tamiflu  twice daily for 5 days.  I am glad you are feeling better after the medication.  Use albuterol  every 4-6 hours as needed for shortness of breath and coughing fits.  Start prednisone  tomorrow (08/11/2024).  Do not take NSAIDs with this medication including aspirin, ibuprofen /Advil , naproxen/Aleve.  Use Promethazine  DM for cough.  This will make you sleepy so do not drive or drink alcohol while taking it.  You can use Mucinex, Flonase, nasal saline/sinus rinses, humidifier in  your room to help manage the symptoms.  If you are not feeling better within 3 to 5 days please return for reevaluation.  If anything worsens and you have high fever, worsening cough, shortness of breath,, chest pain weakness you need to be seen immediately.     ED Prescriptions     Medication Sig Dispense Auth. Provider   oseltamivir  (TAMIFLU ) 75 MG capsule Take 1 capsule (75 mg total) by mouth every 12 (twelve) hours. 10 capsule Ericia Moxley K, PA-C   promethazine -dextromethorphan (PROMETHAZINE -DM) 6.25-15 MG/5ML syrup Take 5 mLs by mouth 2 (two) times daily as needed for cough. 118 mL Keegan Ducey K, PA-C   predniSONE  (DELTASONE ) 20 MG tablet Take 2 tablets (40 mg total) by mouth daily for 5 days. 10 tablet Emileo Semel K, PA-C   albuterol  (VENTOLIN  HFA) 108 (90 Base) MCG/ACT inhaler Inhale 1-2 puffs into the lungs every 6 (six) hours as needed for wheezing or shortness of breath. 18 g Sherron Mapp K, PA-C      PDMP not reviewed this encounter.     [1]  Social History Tobacco Use   Smoking status: Every Day    Current packs/day: 0.50    Types: Cigarettes   Smokeless tobacco: Never  Vaping Use   Vaping status: Never Used  Substance Use Topics   Alcohol use: No   Drug use: Not Currently     Sherrell Rocky POUR, PA-C 08/10/24 1127  "

## 2024-08-10 NOTE — ED Triage Notes (Signed)
 Pt has c/o being short of breath, body aches, fatigue that started 2 days ago. Pt states family members at home are sick, and worried about exposure. Pt has been taking dayquil and nyquil with little relief. Last dose of dayquil at 8am.
